# Patient Record
Sex: Female | Born: 2005 | Race: Black or African American | Hispanic: No | Marital: Single | Smoking: Never smoker
Health system: Southern US, Community
[De-identification: ages and names within clinical notes are randomized; demographics above are authoritative.]

---
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
## 2023-06-08 ENCOUNTER — Other Ambulatory Visit: Payer: Self-pay

## 2023-06-08 ENCOUNTER — Encounter: Payer: Self-pay | Admitting: Allergy & Immunology

## 2023-06-08 ENCOUNTER — Ambulatory Visit (INDEPENDENT_AMBULATORY_CARE_PROVIDER_SITE_OTHER): Payer: Medicaid Other | Admitting: Allergy & Immunology

## 2023-06-08 VITALS — BP 106/70 | HR 68 | Temp 98.3°F | Ht 61.5 in | Wt 127.4 lb

## 2023-06-08 DIAGNOSIS — L500 Allergic urticaria: Secondary | ICD-10-CM | POA: Diagnosis not present

## 2023-06-08 DIAGNOSIS — J302 Other seasonal allergic rhinitis: Secondary | ICD-10-CM | POA: Diagnosis not present

## 2023-06-08 DIAGNOSIS — R21 Rash and other nonspecific skin eruption: Secondary | ICD-10-CM | POA: Diagnosis not present

## 2023-06-08 DIAGNOSIS — T7800XA Anaphylactic reaction due to unspecified food, initial encounter: Secondary | ICD-10-CM | POA: Insufficient documentation

## 2023-06-08 DIAGNOSIS — J3089 Other allergic rhinitis: Secondary | ICD-10-CM | POA: Diagnosis not present

## 2023-06-08 MED ORDER — LEVOCETIRIZINE DIHYDROCHLORIDE 5 MG PO TABS: 5.0000 mg | ORAL_TABLET | Freq: Every evening | ORAL | 1 refills | Status: DC

## 2023-06-08 MED ORDER — EPINEPHRINE 0.3 MG/0.3ML IJ SOAJ: 0.3000 mg | Freq: Once | INTRAMUSCULAR | 2 refills | Status: AC

## 2023-06-08 MED ORDER — EUCRISA 2 % EX OINT: TOPICAL_OINTMENT | CUTANEOUS | 5 refills | Status: DC

## 2023-06-08 MED ORDER — MONTELUKAST SODIUM 10 MG PO TABS: 10.0000 mg | ORAL_TABLET | Freq: Every day | ORAL | 1 refills | Status: DC

## 2023-06-08 NOTE — Progress Notes (Signed)
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 Seasonal and perennial allergic rhinitis - Testing today showed: grasses, ragweed, weeds, trees, indoor molds, outdoor molds, dust mites, cockroach, and mixed feather - Copy of test results provided.  - Avoidance measures provided. - Stop taking: current medications - Continue with:  - Start taking: Xyzal (levocetirizine) 5mg  tablet once daily, Singulair (montelukast) 10mg  daily, and Flonase (fluticasone) one spray per nostril daily (AIM FOR EAR ON EACH SIDE) - You can use an extra dose of the antihistamine, if needed, for breakthrough symptoms.  - Singulair can cause irritability and bad dreams, so beware of this and stop the medication if that happens.  - Consider nasal saline rinses 1-2 times daily to remove allergens from the nasal cavities as well as help with mucous clearance (this is especially helpful to do before the nasal sprays are given) - Consider allergy shots as a means of long-term control. - Allergy shots "re-train" and "reset" the immune system to ignore environmental allergens and decrease the resulting immune response to those allergens (sneezing, itchy watery eyes, runny nose, nasal congestion, etc).    - Allergy shots improve symptoms in 75-85% of patients and are a cure for allergies.  - We can discuss more at the next appointment if the medications are not working for you.  2. Food allergies - Testing was reactive to egg and VERY REACTIVE to  cashew. - Copy of testing results provided today - I would continue to eat egg since you are eating it without a problem. - I would just avoid all tree nuts since you do have some mouth itching from it. - EpiPen training provided. - Emergency anaphylaxis plan provided.   3. Facial rash - I am not sure what this rash is, but I think that since it is itchy, this is most likely eczema. - Add on Eucrisa twice daily to the face (this is NOT a steroid and will not cause side effects of steroids). - It can rarely sting, so put it into the refrigerator to cool it down.  - We will see how this is looking at the next visit and make changes if needed.   4. Return in about 4 weeks (around 07/06/2023). You can have the follow up appointment with Dr. Dellis Anes or a Nurse Practicioner (our Nurse Practitioners are excellent and always have Physician oversight!).   This note in its entirety was forwarded to the Provider who requested this consultation.  Subjective:   Amanda Burton is a 17 y.o. female presenting today for evaluation of  Chief Complaint  Patient presents with   Establish Care   Nasal Congestion    C/o sneezing constantly itchy throat.   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  Amanda Burton was referred by Bobbie Stack, MD.     Amanda Burton is a 18 y.o. female presenting for an evaluation of environmental allergies .  Allergic Rhinitis Symptom History: She has constant  sneezing throughout the year. She has always had issues with allergies.  There is no worse time of the year. She does think that the summer is the worst. She does have symptoms during the winter months as well. She has tried using Benadryl, Claritin, Zyrtec, and any over the counter items. She has not been on nose sprays at all. She has never been allergy tested in the past.   Food Allergy Symptom History: She does et everything without a problem. She has never had throat swelling or itching at all. She does report that her tongue breaks out occasionally. 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 She is eating everything anyway and does not consistently make her break out.  She does have some mouth itching with cashew. She does not eat tree nuts a lot anyway for whatever reason.   Skin Symptom History: She has some discoloration of her nose.  This started this summer. It is pruritic. It is peeling constantly. She thought hat this was sunburn. She has tried using Vaseline to keep it moisturized, but then it is back to being dry and irritated. She thinks that she is mostly itching during the day time. It does not seem to worsen with any particular food.   There is no history of autoimmunity in the family. There is no history of fevers or joint pain.   Otherwise, there is no history of other atopic diseases, including drug allergies, stinging insect allergies, or contact dermatitis. There is no significant infectious history. Vaccinations are up to date.    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 If you have any questions, ask your nurse or doctor.          EPINEPHrine 0.3 mg/0.3 mL Soaj injection Commonly known as: EpiPen 2-Pak Inject 0.3 mg into the muscle once for 1 dose. Started by: Napoleon Form 2 % Oint Generic drug: Crisaborole Use twice daily as needed for facial rash. Started by: Alfonse Spruce   levocetirizine 5 MG tablet Commonly known as: XYZAL Take 1 tablet (5 mg total) by mouth every evening. Started by: Alfonse Spruce   metroNIDAZOLE 500 MG tablet Commonly known as: FLAGYL Take 1 tablet (500 mg total) by mouth 2 (two) times daily.   montelukast 10 MG tablet Commonly known as: Singulair Take 1 tablet (10 mg total) by mouth at bedtime. Started by: Alfonse Spruce   Nexplanon 68 MG Impl implant Generic drug: etonogestrel  1 each by Subdermal route once.        Birth History: non-contributory  Developmental History: non-contributory  Past Surgical History: History reviewed. No pertinent surgical history.   Family History: Family History  Problem Relation Age of Onset   Diabetes Father      Social History: Angi lives at home with her family.  She lives in a house with tile throughout the home.  She has electric heating and central cooling.  There are dogs outside of the home.  There are dust mite covers on the bed, but not the pillows.  There is vape exposure in the house as well as the car.  There is no fume, chemical, or dust exposure.  They do not use a HEPA filter.  She graduated high school in May 2023.  She is thinking about doing cosmetology school.   Review of systems otherwise negative other than that mentioned in the HPI.    Objective:   Blood pressure 106/70, pulse 68, temperature 98.3 F (36.8 C), height 5' 1.5" (1.562 m), weight 127 lb 6.4 oz (57.8 kg),  SpO2 99%. Body mass index is 23.68 kg/m.     Physical Exam Constitutional:      Appearance: She is well-developed.  HENT:     Head: Normocephalic and atraumatic.     Right Ear: Tympanic membrane, ear canal and external ear normal. No drainage, swelling or tenderness. Tympanic membrane is not injected, scarred, erythematous, retracted or bulging.     Left Ear: Tympanic membrane, ear canal and external ear normal. No drainage, swelling or tenderness. Tympanic membrane is not injected, scarred, erythematous, retracted or bulging.     Nose: No nasal deformity, septal deviation, mucosal edema or rhinorrhea.     Right Turbinates: Enlarged, swollen and pale.     Left Turbinates: Enlarged, swollen and pale.     Right Sinus: No maxillary sinus tenderness or frontal sinus tenderness.     Left Sinus: No maxillary sinus tenderness or frontal sinus tenderness.     Comments: No polyps noted.     Mouth/Throat:     Mouth: Mucous membranes are not pale and not dry.     Pharynx: Uvula midline.  Eyes:     General:        Right eye: No discharge.        Left eye: No discharge.     Conjunctiva/sclera: Conjunctivae normal.     Right eye: Right conjunctiva is not injected. No chemosis.    Left eye: Left conjunctiva is not injected. No chemosis.    Pupils: Pupils are equal, round, and reactive to light.  Cardiovascular:     Rate and Rhythm: Normal rate and regular rhythm.     Heart sounds: Normal heart sounds.  Pulmonary:     Effort: Pulmonary effort is normal. No tachypnea, accessory muscle usage or respiratory distress.     Breath sounds: Normal breath sounds. No wheezing, rhonchi or rales.  Chest:     Chest wall: No tenderness.  Abdominal:     Tenderness: There is no abdominal tenderness. There is no guarding or rebound.  Lymphadenopathy:     Head:     Right side of head: No submandibular, tonsillar or occipital adenopathy.     Left side of head: No submandibular, tonsillar or occipital  adenopathy.     Cervical: No cervical adenopathy.  Skin:    General: Skin is warm.     Capillary Refill: Capillary refill takes less than 2 seconds.  Coloration: Skin is not pale.     Findings: No abrasion, erythema, petechiae or rash. Rash is not papular, urticarial or vesicular.     Comments: She does have a rash on her face that is in a butterfly distribution. It is slightly hypopigmented. See picture below.   Neurological:     Mental Status: She is alert.        Diagnostic studies:   Allergy Studies:     Airborne Adult Perc - 06/08/23 1533     Time Antigen Placed 1533    Allergen Manufacturer Waynette Buttery    Location Back    Number of Test 55    1. Control-Buffer 50% Glycerol Negative    2. Control-Histamine 2+    3. Bahia 2+    4. [place] Negative    5. Johnson 2+    6. Kentucky Blue 4+    7. Meadow Fescue 4+    8. Perennial Rye 4+    9. Timothy 4+    10. Ragweed Mix 4+    11. Cocklebur 2+    12. Plantain,  English 3+    13. Baccharis 2+    14. Dog Fennel 2+    15. Gu[place] Thistle 2+    16. Lamb's Quarters 2+    17. Sheep Sorrell 2+    18. Rough Pigweed Negative    19. Marsh Elder, Rough Negative    20. Mugwort, Common 2+    21. Box, Elder 2+    22. Cedar, red Negative    23. Sweet Gum 4+    24. Pecan Pollen 4+    25. Pine Mix 4+    26. Walnut, Black Pollen 4+    27. Red Mulberry 4+    28. Ash Mix 2+    29. Birch Mix 3+    30. Beech American 3+    31. Cottonwood, [place] 2+    32. Hickory, White 4+    33. Maple Mix 4+    34. Oak, [place] Mix 4+    35. Sycamore Eastern 4+    36. Alternaria Alternata 4+    37. Cladosporium Herbarum 3+    38. Aspergillus Mix 3+    39. Penicillium Mix 2+    40. Bipolaris Sorokiniana (Helminthosporium) 2+    41. Drechslera Spicifera (Curvularia) Negative    42. Mucor Plumbeus 3+    43. Fusarium Moniliforme 2+    44. Aureobasidium Pullulans (pullulara) 3+    45. Rhizopus Oryzae 3+    46. Botrytis Cinera Negative     47. Epicoccum Nigrum 3+    48. Phoma Betae 2+    49. Dust Mite Mix 4+    50. Cat Hair 10,000 BAU/ml Negative    51.  Dog Epithelia Negative    52. Mixed Feathers 2+    53. Horse Epithelia Negative    54. Cockroach, German 3+    55. Tobacco Leaf Negative             13 Food Perc - 06/08/23 1533       Test Information   Time Antigen Placed 1533    Allergen Manufacturer Waynette Buttery    Location Back    Number of allergen test 13      Food   1. Peanut Negative    2. Soybean Negative    3. Wheat Negative    4. Sesame Negative    5. Milk, Cow Negative    6. Casein Negative    7. Egg [place],  Chicken --   7 x 10   8. Shellfish Mix Negative    9. Fish Mix Negative    10. Cashew --   35 x 50   11. Walnut Food Negative    12. Almond Negative    13. Hazelnut Negative             Allergy testing results were read and interpreted by myself, documented by clinical staff.         Malachi Bonds, MD [facility]

## 2023-06-08 NOTE — Patient Instructions (Addendum)
1. Seasonal and perennial allergic rhinitis - Testing today showed: grasses, ragweed, weeds, trees, indoor molds, outdoor molds, dust mites, cockroach, and mixed feather - Copy of test results provided.  - Avoidance measures provided. - Stop taking: current medications - Continue with:  - Start taking: Xyzal (levocetirizine) 5mg  tablet once daily, Singulair (montelukast) 10mg  daily, and Flonase (fluticasone) one spray per nostril daily (AIM FOR EAR ON EACH SIDE) - You can use an extra dose of the antihistamine, if needed, for breakthrough symptoms.  - Singulair can cause irritability and bad dreams, so beware of this and stop the medication if that happens.  - Consider nasal saline rinses 1-2 times daily to remove allergens from the nasal cavities as well as help with mucous clearance (this is especially helpful to do before the nasal sprays are given) - Consider allergy shots as a means of long-term control. - Allergy shots "re-train" and "reset" the immune system to ignore environmental allergens and decrease the resulting immune response to those allergens (sneezing, itchy watery eyes, runny nose, nasal congestion, etc).    - Allergy shots improve symptoms in 75-85% of patients and are a cure for allergies.  - We can discuss more at the next appointment if the medications are not working for you.  2. Food allergies - Testing was reactive to egg and VERY REACTIVE to cashew. - Copy of testing results provided today - I would continue to eat egg since you are eating it without a problem. - I would just avoid all tree nuts since you do have some mouth itching from it. - EpiPen training provided. - Emergency anaphylaxis plan provided.   3. Facial rash - I am not sure what this rash is, but I think that since it is itchy, this is most likely eczema. - Add on Eucrisa twice daily to the face (this is NOT a steroid and will not cause side effects of steroids). - It can rarely sting, so put it  into the refrigerator to cool it down.  - We will see how this is looking at the next visit and make changes if needed.   4. Return in about 4 weeks (around 07/06/2023). You can have the follow up appointment with Dr. Dellis Anes or a Nurse Practicioner (our Nurse Practitioners are excellent and always have Physician oversight!).    Please inform us of any Emergency Department visits, hospitalizations, or changes in symptoms. Call us before going to the ED for breathing or allergy symptoms since we might be able to fit you in for a sick visit. Feel free to contact us anytime with any questions, problems, or concerns.  It was a pleasure to meet you today!  Websites that have reliable patient information: 1. American Academy of Asthma, Allergy, and Immunology: www.aaaai.org 2. Food Allergy Research and Education (FARE): foodallergy.org 3. Mothers of Asthmatics: http://www.asthmacommunitynetwork.org 4. American College of Allergy, Asthma, and Immunology: www.acaai.org   COVID-19 Vaccine Information can be found at: [email or web address] For questions related to vaccine distribution or appointments, please email [email or web address] or call [phone].     "Like" [place] on Facebook and Instagram for our latest updates!      A healthy democracy works best when [employer] participate! Make sure you are registered to vote! If you have moved or changed any of your contact information, you will need to get this updated before voting! Scan the QR codes below to learn more!        Airborne Adult Perc - 06/08/23 1533  Time Antigen Placed 1533    Allergen Manufacturer Greer    Location Back    Number of Test 55    1. Control-Buffer 50% Glycerol Negative    2. Control-Histamine 2+    3. Bahia 2+    4. [place] Negative    5. Johnson 2+    6. Kentucky Blue 4+    7. Meadow Fescue 4+    8. Perennial Rye 4+    9. Timothy 4+    10.  Ragweed Mix 4+    11. Cocklebur 2+    12. Plantain,  English 3+    13. Baccharis 2+    14. Dog Fennel 2+    15. Gu[place] Thistle 2+    16. Lamb's Quarters 2+    17. Sheep Sorrell 2+    18. Rough Pigweed Negative    19. Marsh Elder, Rough Negative    20. Mugwort, Common 2+    21. Box, Elder 2+    22. Cedar, red Negative    23. Sweet Gum 4+    24. Pecan Pollen 4+    25. Pine Mix 4+    26. Walnut, Black Pollen 4+    27. Red Mulberry 4+    28. Ash Mix 2+    29. Birch Mix 3+    30. Beech American 3+    31. Cottonwood, [place] 2+    32. Hickory, White 4+    33. Maple Mix 4+    34. Oak, [place] Mix 4+    35. Sycamore Eastern 4+    36. Alternaria Alternata 4+    37. Cladosporium Herbarum 3+    38. Aspergillus Mix 3+    39. Penicillium Mix 2+    40. Bipolaris Sorokiniana (Helminthosporium) 2+    41. Drechslera Spicifera (Curvularia) Negative    42. Mucor Plumbeus 3+    43. Fusarium Moniliforme 2+    44. Aureobasidium Pullulans (pullulara) 3+    45. Rhizopus Oryzae 3+    46. Botrytis Cinera Negative    47. Epicoccum Nigrum 3+    48. Phoma Betae 2+    49. Dust Mite Mix 4+    50. Cat Hair 10,000 BAU/ml Negative    51.  Dog Epithelia Negative    52. Mixed Feathers 2+    53. Horse Epithelia Negative    54. Cockroach, German 3+    55. Tobacco Leaf Negative             13 Food Perc - 06/08/23 1533       Test Information   Time Antigen Placed 1533    Allergen Manufacturer Waynette Buttery    Location Back    Number of allergen test 13      Food   1. Peanut Negative    2. Soybean Negative    3. Wheat Negative    4. Sesame Negative    5. Milk, Cow Negative    6. Casein Negative    7. Egg White, Chicken --   7 x 10   8. Shellfish Mix Negative    9. Fish Mix Negative    10. Cashew --   35 x 50   11. Walnut Food Negative    12. Almond Negative    13. Hazelnut Negative             Reducing Pollen Exposure  The American Academy of Allergy, Asthma and Immunology  suggests the following steps to reduce your exposure to pollen during allergy seasons.  Do not hang sheets or clothing out to dry; pollen may collect on these items. Do not mow lawns or spend time around freshly cut grass; mowing stirs up pollen. Keep windows closed at night.  Keep car windows closed while driving. Minimize morning activities outdoors, a time when pollen counts are usually at their highest. Stay indoors as much as possible when pollen counts or humidity is high and on windy days when pollen tends to remain in the air longer. Use air conditioning when possible.  Many air conditioners have filters that trap the pollen spores. Use a HEPA room air filter to remove pollen form the indoor air you breathe.  Control of Mold Allergen   Mold and fungi can grow on a variety of surfaces provided certain temperature and moisture conditions exist.  Outdoor molds grow on plants, decaying vegetation and soil.  The major outdoor mold, Alternaria and Cladosporium, are found in very high numbers during hot and dry conditions.  Generally, a late Summer - Fall peak is seen for common outdoor fungal spores.  Rain will temporarily lower outdoor mold spore count, but counts rise rapidly when the rainy period ends.  The most important indoor molds are Aspergillus and Penicillium.  Dark, humid and poorly ventilated basements are ideal sites for mold growth.  The next most common sites of mold growth are the bathroom and the kitchen.  Outdoor (Seasonal) Mold Control   Use air conditioning and keep windows closed Avoid exposure to decaying vegetation. Avoid leaf raking. Avoid grain handling. Consider wearing a face mask if working in moldy areas.   Indoor (Perennial) Mold Control    Maintain humidity below 50%. Clean washable surfaces with 5% bleach solution. Remove sources e.g. contaminated carpets.    Control of Dust Mite Allergen    Dust mites play a major role in allergic asthma and  rhinitis.  They occur in environments with high humidity wherever human skin is found.  Dust mites absorb humidity from the atmosphere (ie, they do not drink) and feed on organic matter (including shed human and animal skin).  Dust mites are a microscopic type of insect that you cannot see with the naked eye.  High levels of dust mites have been detected from mattresses, pillows, carpets, upholstered furniture, bed covers, clothes, soft toys and any woven material.  The principal allergen of the dust mite is found in its feces.  A gram of dust may contain 1,000 mites and 250,000 fecal particles.  Mite antigen is easily measured in the air during house cleaning activities.  Dust mites do not bite and do not cause harm to humans, other than by triggering allergies/asthma.    Ways to decrease your exposure to dust mites in your home:  Encase mattresses, box springs and pillows with a mite-impermeable barrier or cover   Wash sheets, blankets and drapes weekly in hot water (130 F) with detergent and dry them in a dryer on the hot setting.  Have the room cleaned frequently with a vacuum cleaner and a damp dust-mop.  For carpeting or rugs, vacuuming with a vacuum cleaner equipped with a high-efficiency particulate air (HEPA) filter.  The dust mite allergic individual should not be in a room which is being cleaned and should wait 1 hour after cleaning before going into the room. Do not sleep on upholstered furniture (eg, couches).   If possible removing carpeting, upholstered furniture and drapery from the home is ideal.  Horizontal blinds should be eliminated in the rooms where  the person spends the most time (bedroom, study, television room).  Washable vinyl, roller-type shades are optimal. Remove all non-washable stuffed toys from the bedroom.  Wash stuffed toys weekly like sheets and blankets above.   Reduce indoor humidity to less than 50%.  Inexpensive humidity monitors can be purchased at most hardware  stores.  Do not use a humidifier as can make the problem worse and are not recommended.  Control of Cockroach Allergen  Cockroach allergen has been identified as an important cause of acute attacks of asthma, especially in urban settings.  There are fifty-five species of cockroach that exist in the [place], however only three, the [place], [place] species produce allergen that can affect patients with Asthma.  Allergens can be obtained from fecal particles, egg casings and secretions from cockroaches.    Remove food sources. Reduce access to water. Seal access and entry points. Spray runways with 0.5-1% Diazinon or Chlorpyrifos Blow boric acid power under stoves and refrigerator. Place bait stations (hydramethylnon) at feeding sites.  Allergy Shots  Allergies are the result of a chain reaction that starts in the immune system. Your immune system controls how your body defends itself. For instance, if you have an allergy to pollen, your immune system identifies pollen as an invader or allergen. Your immune system overreacts by producing antibodies called Immunoglobulin E (IgE). These antibodies travel to cells that release chemicals, causing an allergic reaction.  The concept behind allergy immunotherapy, whether it is received in the form of shots or tablets, is that the immune system can be desensitized to specific allergens that trigger allergy symptoms. Although it requires time and patience, the payback can be long-term relief. Allergy injections contain a dilute solution of those substances that you are allergic to based upon your skin testing and allergy history.   How Do Allergy Shots Work?  Allergy shots work much like a vaccine. Your body responds to injected amounts of a particular allergen given in increasing doses, eventually developing a resistance and tolerance to it. Allergy shots can lead to decreased, minimal or no allergy symptoms.  There generally are two  phases: build-up and maintenance. Build-up often ranges from three to six months and involves receiving injections with increasing amounts of the allergens. The shots are typically given once or twice a week, though more rapid build-up schedules are sometimes used.  The maintenance phase begins when the most effective dose is reached. This dose is different for each person, depending on how allergic you are and your response to the build-up injections. Once the maintenance dose is reached, there are longer periods between injections, typically two to four weeks.  Occasionally doctors give cortisone-type shots that can temporarily reduce allergy symptoms. These types of shots are different and should not be confused with allergy immunotherapy shots.  Who Can Be Treated with Allergy Shots?  Allergy shots may be a good treatment approach for people with allergic rhinitis (hay fever), allergic asthma, conjunctivitis (eye allergy) or stinging insect allergy.   Before deciding to begin allergy shots, you should consider:   The length of allergy season and the severity of your symptoms  Whether medications and/or changes to your environment can control your symptoms  Your desire to avoid long-term medication use  Time: allergy immunotherapy requires a major time commitment  Cost: may vary depending on your insurance coverage  Allergy shots for children age 39 and older are effective and often well tolerated. They might prevent the onset of new allergen  sensitivities or the progression to asthma.  Allergy shots are not started on patients who are pregnant but can be continued on patients who become pregnant while receiving them. In some patients with other medical conditions or who take certain common medications, allergy shots may be of risk. It is important to mention other medications you talk to your allergist.   What are the two types of build-ups offered:   RUSH or Rapid Desensitization --  one day of injections lasting from 8:30-4:30pm, injections every 1 hour.  Approximately half of the build-up process is completed in that one day.  The following week, normal build-up is resumed, and this entails ~16 visits either weekly or twice weekly, until reaching your "maintenance dose" which is continued weekly until eventually getting spaced out to every month for a duration of 3 to 5 years. The regular build-up appointments are nurse visits where the injections are administered, followed by required monitoring for 30 minutes.    Traditional build-up -- weekly visits for 6 -12 months until reaching "maintenance dose", then continue weekly until eventually spacing out to every 4 weeks as above. At these appointments, the injections are administered, followed by required monitoring for 30 minutes.     Either way is acceptable, and both are equally effective. With the rush protocol, the advantage is that less time is spent here for injections overall AND you would also reach maintenance dosing faster (which is when the clinical benefit starts to become more apparent). Not everyone is a candidate for rapid desensitization.   IF we proceed with the RUSH protocol, there are premedications which must be taken the day before and the day after the rush only (this includes antihistamines, steroids, and Singulair).  After the rush day, no prednisone or Singulair is required, and we just recommend antihistamines taken on your injection day.  What Is An Estimate of the Costs?  If you are interested in starting allergy injections, please check with your insurance company about your coverage for both allergy vial sets and allergy injections.  Please do so prior to making the appointment to start injections.  The following are CPT codes to give to your insurance company. These are the amounts we BILL to the insurance company, but the amount YOU WILL PAY and WE RECEIVE IS SUBSTANTIALLY LESS and depends on the  contracts we have with different insurance companies.   Amount Billed to Insurance One allergy vial set  CPT 95165   $ 1200     Two allergy vial set  CPT 95165   $ 2400     Three allergy vial set  CPT 95165   $ 3600     One injection   CPT 95115   $ 35  Two injections   CPT 95117   $ 40 RUSH (Rapid Desensitization) CPT 95180 x 8 hours $500/hour  Regarding the allergy injections, your co-pay may or may not apply with each injection, so please confirm this with your insurance company. When you start allergy injections, 1 or 2 sets of vials are made based on your allergies.  Not all patients can be on one set of vials. A set of vials lasts 6 months to a year depending on how quickly you can proceed with your build-up of your allergy injections. Vials are personalized for each patient depending on their specific allergens.  How often are allergy injection given during the build-up period?   Injections are given at least weekly during the build-up period until your  maintenance dose is achieved. Per the doctor's discretion, you may have the option of getting allergy injections two times per week during the build-up period. However, there must be at least 48 hours between injections. The build-up period is usually completed within 6-12 months depending on your ability to schedule injections and for adjustments for reactions. When maintenance dose is reached, your injection schedule is gradually changed to every two weeks and later to every three weeks. Injections will then continue every 4 weeks. Usually, injections are continued for a total of 3-5 years.   When Will I Feel Better?  Some may experience decreased allergy symptoms during the build-up phase. For others, it may take as long as 12 months on the maintenance dose. If there is no improvement after a year of maintenance, your allergist will discuss other treatment options with you.  If you aren't responding to allergy shots, it may be because  there is not enough dose of the allergen in your vaccine or there are missing allergens that were not identified during your allergy testing. Other reasons could be that there are high levels of the allergen in your environment or major exposure to non-allergic triggers like tobacco smoke.  What Is the Length of Treatment?  Once the maintenance dose is reached, allergy shots are generally continued for three to five years. The decision to stop should be discussed with your allergist at that time. Some people may experience a permanent reduction of allergy symptoms. Others may relapse and a longer course of allergy shots can be considered.  What Are the Possible Reactions?  The two types of adverse reactions that can occur with allergy shots are local and systemic. Common local reactions include very mild redness and swelling at the injection site, which can happen immediately or several hours after. Report a delayed reaction from your last injection. These include arm swelling or runny nose, watery eyes or cough that occurs within 12-24 hours after injection. A systemic reaction, which is less common, affects the entire body or a particular body system. They are usually mild and typically respond quickly to medications. Signs include increased allergy symptoms such as sneezing, a stuffy nose or hives.   Rarely, a serious systemic reaction called anaphylaxis can develop. Symptoms include swelling in the throat, wheezing, a feeling of tightness in the chest, nausea or dizziness. Most serious systemic reactions develop within 30 minutes of allergy shots. This is why it is strongly recommended you wait in your doctor's office for 30 minutes after your injections. Your allergist is trained to watch for reactions, and his or her staff is trained and equipped with the proper medications to identify and treat them.   Report to the nurse immediately if you experience any of the following symptoms: swelling,  itching or redness of the skin, hives, watery eyes/nose, breathing difficulty, excessive sneezing, coughing, stomach pain, diarrhea, or light headedness. These symptoms may occur within 15-20 minutes after injection and may require medication.   Who Should Administer Allergy Shots?  The preferred location for receiving shots is your prescribing allergist's office. Injections can sometimes be given at another facility where the physician and staff are trained to recognize and treat reactions, and have received instructions by your prescribing allergist.  What if I am late for an injection?   Injection dose will be adjusted depending upon how many days or weeks you are late for your injection.   What if I am sick?   Please report any illness to the  nurse before receiving injections. She may adjust your dose or postpone injections depending on your symptoms. If you have fever, flu, sinus infection or chest congestion it is best to postpone allergy injections until you are better. Never get an allergy injection if your asthma is causing you problems. If your symptoms persist, seek out medical care to get your health problem under control.  What If I am or Become Pregnant:  Women that become pregnant should schedule an appointment with [facility] before receiving any further allergy injections.

## 2023-06-09 ENCOUNTER — Other Ambulatory Visit: Payer: Self-pay

## 2023-06-09 MED ORDER — EPINEPHRINE 0.3 MG/0.3ML IJ SOAJ: 0.3000 mg | Freq: Once | INTRAMUSCULAR | 1 refills | Status: AC

## 2023-06-10 ENCOUNTER — Other Ambulatory Visit (HOSPITAL_COMMUNITY): Payer: Self-pay

## 2023-06-10 ENCOUNTER — Telehealth: Payer: Self-pay

## 2023-06-10 NOTE — Telephone Encounter (Signed)
*  Asthma/Allergy  Pharmacy Patient Advocate Encounter   Received notification from CoverMyMeds that prior authorization for Eucrisa 2% ointment  is required/requested.   Insurance verification completed.   The patient is insured through [facility] .   Per test claim: PA required; PA submitted to [facility] via CoverMyMeds Key/confirmation #/EOC [id number] Status is pending

## 2023-06-14 ENCOUNTER — Other Ambulatory Visit (HOSPITAL_COMMUNITY): Payer: Self-pay

## 2023-06-14 NOTE — Telephone Encounter (Signed)
Pharmacy Patient Advocate Encounter  Received notification from [facility] that Prior Authorization for Eucrisa 2% ointment  has been APPROVED from 06/10/2023 to 06/09/2024. Ran test claim, Copay is $refill too soon. This test claim was processed through [facility]- copay amounts may vary at other pharmacies due to pharmacy/plan contracts, or as the patient moves through the different stages of their insurance plan.

## 2023-07-08 ENCOUNTER — Telehealth: Payer: Self-pay | Admitting: Family Medicine

## 2023-07-08 NOTE — Telephone Encounter (Signed)
Patient father called asked if provider will accept her as a new patient. Father already sees Gilmore Laroche. Call back # [phone].

## 2023-07-08 NOTE — Telephone Encounter (Signed)
scheduled

## 2023-07-08 NOTE — Telephone Encounter (Signed)
yes

## 2023-07-11 NOTE — Patient Instructions (Incomplete)
1. Seasonal and perennial allergic rhinitis - Testing on 06/18/23 showed: grasses, ragweed, weeds, trees, indoor molds, outdoor molds, dust mites, cockroach, and mixed feather - Continue voidance measures as below . - Start taking: Xyzal (levocetirizine) 5mg  tablet once daily, Singulair (montelukast) 10mg  daily, and Flonase (fluticasone) one spray per nostril daily (AIM FOR EAR ON EACH SIDE) - You can use an extra dose of the antihistamine, if needed, for breakthrough symptoms.  - Singulair can cause irritability and bad dreams, so beware of this and stop the medication if that happens.  - Consider nasal saline rinses 1-2 times daily to remove allergens from the nasal cavities as well as help with mucous clearance (this is especially helpful to do before the nasal sprays are given) - Consider allergy shots as a means of long-term control. - Allergy shots "re-train" and "reset" the immune system to ignore environmental allergens and decrease the resulting immune response to those allergens (sneezing, itchy watery eyes, runny nose, nasal congestion, etc).    - Allergy shots improve symptoms in 75-85% of patients and are a cure for allergies.  - We can discuss more at the next appointment if the medications are not working for you.  2. Food allergies - Testing on 06/18/23 was reactive to egg and VERY REACTIVE to cashew. - Continue to eat egg since you are eating it without a problem. - I would just avoid all tree nuts since you do have some mouth itching from it. -  have access to EpiPen at all times. Prescription sent. - Emergency anaphylaxis plan provided at previous office visit.   3. Facial rash - Add on Eucrisa twice daily to the face (this is NOT a steroid and will not cause side effects of steroids). - It can rarely sting, so put it into the refrigerator to cool it down.  - We will see how this is looking at the next visit and make changes if needed.   4.Schedule a follow up appointment  in 6 weeks or sooner if needed       Reducing Pollen Exposure  The American Academy of All[employer], Ast[employer] suggests the following steps to reduce your exposure to pollen during allergy seasons.    Do not hang sheets or clothing out to dry; pollen may collect on these items. Do not mow lawns or spend time around freshly cut grass; mowing stirs up pollen. Keep windows closed at night.  Keep car windows closed while driving. Minimize morning activities outdoors, a time when pollen counts are usually at their highest. Stay indoors as much as possible when pollen counts or humidity is high and on windy days when pollen tends to remain in the air longer. Use air conditioning when possible.  Many air conditioners have filters that trap the pollen spores. Use a HEPA room air filter to remove pollen form the indoor air you breathe.  Control of Mold Allergen   Mold and fungi can grow on a variety of surfaces provided certain temperature and moisture conditions exist.  Outdoor molds grow on plants, decaying vegetation and soil.  The major outdoor mold, Alternaria and Cladosporium, are found in very high numbers during hot and dry conditions.  Generally, a late Summer - Fall peak is seen for common outdoor fungal spores.  Rain will temporarily lower outdoor mold spore count, but counts rise rapidly when the rainy period ends.  The most important indoor molds are Aspergillus and Penicillium.  Dark, humid and poorly ventilated basements are ideal  sites for mold growth.  The next most common sites of mold growth are the bathroom and the kitchen.  Outdoor (Seasonal) Mold Control   Use air conditioning and keep windows closed Avoid exposure to decaying vegetation. Avoid leaf raking. Avoid grain handling. Consider wearing a face mask if working in moldy areas.   Indoor (Perennial) Mold Control    Maintain humidity below 50%. Clean washable surfaces with 5% bleach solution. Remove  sources e.g. contaminated carpets.    Control of Dust Mite Allergen    Dust mites play a major role in allergic asthma and rhinitis.  They occur in environments with high humidity wherever human skin is found.  Dust mites absorb humidity from the atmosphere (ie, they do not drink) and feed on organic matter (including shed human and animal skin).  Dust mites are a microscopic type of insect that you cannot see with the naked eye.  High levels of dust mites have been detected from mattresses, pillows, carpets, upholstered furniture, bed covers, clothes, soft toys and any woven material.  The principal allergen of the dust mite is found in its feces.  A gram of dust may contain 1,000 mites and 250,000 fecal particles.  Mite antigen is easily measured in the air during house cleaning activities.  Dust mites do not bite and do not cause harm to humans, other than by triggering allergies/asthma.    Ways to decrease your exposure to dust mites in your home:  Encase mattresses, box springs and pillows with a mite-impermeable barrier or cover   Wash sheets, blankets and drapes weekly in hot water (130 F) with detergent and dry them in a dryer on the hot setting.  Have the room cleaned frequently with a vacuum cleaner and a damp dust-mop.  For carpeting or rugs, vacuuming with a vacuum cleaner equipped with a high-efficiency particulate air (HEPA) filter.  The dust mite allergic individual should not be in a room which is being cleaned and should wait 1 hour after cleaning before going into the room. Do not sleep on upholstered furniture (eg, couches).   If possible removing carpeting, upholstered furniture and drapery from the home is ideal.  Horizontal blinds should be eliminated in the rooms where the person spends the most time (bedroom, study, television room).  Washable vinyl, roller-type shades are optimal. Remove all non-washable stuffed toys from the bedroom.  Wash stuffed toys weekly like sheets  and blankets above.   Reduce indoor humidity to less than 50%.  Inexpensive humidity monitors can be purchased at most hardware stores.  Do not use a humidifier as can make the problem worse and are not recommended.  Control of Cockroach Allergen  Cockroach allergen has been identified as an important cause of acute attacks of asthma, especially in urban settings.  There are fifty-five species of cockroach that exist in the [place], however only three, the [place], [place] species produce allergen that can affect patients with Asthma.  Allergens can be obtained from fecal particles, egg casings and secretions from cockroaches.    Remove food sources. Reduce access to water. Seal access and entry points. Spray runways with 0.5-1% Diazinon or Chlorpyrifos Blow boric acid power under stoves and refrigerator. Place bait stations (hydramethylnon) at feeding sites.  Allergy Shots  Allergies are the result of a chain reaction that starts in the immune system. Your immune system controls how your body defends itself. For instance, if you have an allergy to pollen, your immune system identifies pollen  as an invader or allergen. Your immune system overreacts by producing antibodies called Immunoglobulin E (IgE). These antibodies travel to cells that release chemicals, causing an allergic reaction.  The concept behind allergy immunotherapy, whether it is received in the form of shots or tablets, is that the immune system can be desensitized to specific allergens that trigger allergy symptoms. Although it requires time and patience, the payback can be long-term relief. Allergy injections contain a dilute solution of those substances that you are allergic to based upon your skin testing and allergy history.   How Do Allergy Shots Work?  Allergy shots work much like a vaccine. Your body responds to injected amounts of a particular allergen given in increasing doses, eventually developing a  resistance and tolerance to it. Allergy shots can lead to decreased, minimal or no allergy symptoms.  There generally are two phases: build-up and maintenance. Build-up often ranges from three to six months and involves receiving injections with increasing amounts of the allergens. The shots are typically given once or twice a week, though more rapid build-up schedules are sometimes used.  The maintenance phase begins when the most effective dose is reached. This dose is different for each person, depending on how allergic you are and your response to the build-up injections. Once the maintenance dose is reached, there are longer periods between injections, typically two to four weeks.  Occasionally doctors give cortisone-type shots that can temporarily reduce allergy symptoms. These types of shots are different and should not be confused with allergy immunotherapy shots.  Who Can Be Treated with Allergy Shots?  Allergy shots may be a good treatment approach for people with allergic rhinitis (hay fever), allergic asthma, conjunctivitis (eye allergy) or stinging insect allergy.   Before deciding to begin allergy shots, you should consider:   The length of allergy season and the severity of your symptoms  Whether medications and/or changes to your environment can control your symptoms  Your desire to avoid long-term medication use  Time: allergy immunotherapy requires a major time commitment  Cost: may vary depending on your insurance coverage  Allergy shots for children age 41 and older are effective and often well tolerated. They might prevent the onset of new allergen sensitivities or the progression to asthma.  Allergy shots are not started on patients who are pregnant but can be continued on patients who become pregnant while receiving them. In some patients with other medical conditions or who take certain common medications, allergy shots may be of risk. It is important to mention other  medications you talk to your allergist.   What are the two types of build-ups offered:   RUSH or Rapid Desensitization -- one day of injections lasting from 8:30-4:30pm, injections every 1 hour.  Approximately half of the build-up process is completed in that one day.  The following week, normal build-up is resumed, and this entails ~16 visits either weekly or twice weekly, until reaching your "maintenance dose" which is continued weekly until eventually getting spaced out to every month for a duration of 3 to 5 years. The regular build-up appointments are nurse visits where the injections are administered, followed by required monitoring for 30 minutes.    Traditional build-up -- weekly visits for 6 -12 months until reaching "maintenance dose", then continue weekly until eventually spacing out to every 4 weeks as above. At these appointments, the injections are administered, followed by required monitoring for 30 minutes.     Either way is acceptable, and both are  equally effective. With the rush protocol, the advantage is that less time is spent here for injections overall AND you would also reach maintenance dosing faster (which is when the clinical benefit starts to become more apparent). Not everyone is a candidate for rapid desensitization.   IF we proceed with the RUSH protocol, there are premedications which must be taken the day before and the day after the rush only (this includes antihistamines, steroids, and Singulair).  After the rush day, no prednisone or Singulair is required, and we just recommend antihistamines taken on your injection day.  What Is An Estimate of the Costs?  If you are interested in starting allergy injections, please check with your insurance company about your coverage for both allergy vial sets and allergy injections.  Please do so prior to making the appointment to start injections.  The following are CPT codes to give to your insurance company. These are the  amounts we BILL to the insurance company, but the amount YOU WILL PAY and WE RECEIVE IS SUBSTANTIALLY LESS and depends on the contracts we have with different insurance companies.   Amount Billed to Insurance One allergy vial set  CPT 95165   $ 1200     Two allergy vial set  CPT 95165   $ 2400     Three allergy vial set  CPT 95165   $ 3600     One injection   CPT 95115   $ 35  Two injections   CPT 95117   $ 40 RUSH (Rapid Desensitization) CPT 95180 x 8 hours $500/hour  Regarding the allergy injections, your co-pay may or may not apply with each injection, so please confirm this with your insurance company. When you start allergy injections, 1 or 2 sets of vials are made based on your allergies.  Not all patients can be on one set of vials. A set of vials lasts 6 months to a year depending on how quickly you can proceed with your build-up of your allergy injections. Vials are personalized for each patient depending on their specific allergens.  How often are allergy injection given during the build-up period?   Injections are given at least weekly during the build-up period until your maintenance dose is achieved. Per the doctor's discretion, you may have the option of getting allergy injections two times per week during the build-up period. However, there must be at least 48 hours between injections. The build-up period is usually completed within 6-12 months depending on your ability to schedule injections and for adjustments for reactions. When maintenance dose is reached, your injection schedule is gradually changed to every two weeks and later to every three weeks. Injections will then continue every 4 weeks. Usually, injections are continued for a total of 3-5 years.   When Will I Feel Better?  Some may experience decreased allergy symptoms during the build-up phase. For others, it may take as long as 12 months on the maintenance dose. If there is no improvement after a year of maintenance,  your allergist will discuss other treatment options with you.  If you aren't responding to allergy shots, it may be because there is not enough dose of the allergen in your vaccine or there are missing allergens that were not identified during your allergy testing. Other reasons could be that there are high levels of the allergen in your environment or major exposure to non-allergic triggers like tobacco smoke.  What Is the Length of Treatment?  Once the maintenance dose  is reached, allergy shots are generally continued for three to five years. The decision to stop should be discussed with your allergist at that time. Some people may experience a permanent reduction of allergy symptoms. Others may relapse and a longer course of allergy shots can be considered.  What Are the Possible Reactions?  The two types of adverse reactions that can occur with allergy shots are local and systemic. Common local reactions include very mild redness and swelling at the injection site, which can happen immediately or several hours after. Report a delayed reaction from your last injection. These include arm swelling or runny nose, watery eyes or cough that occurs within 12-24 hours after injection. A systemic reaction, which is less common, affects the entire body or a particular body system. They are usually mild and typically respond quickly to medications. Signs include increased allergy symptoms such as sneezing, a stuffy nose or hives.   Rarely, a serious systemic reaction called anaphylaxis can develop. Symptoms include swelling in the throat, wheezing, a feeling of tightness in the chest, nausea or dizziness. Most serious systemic reactions develop within 30 minutes of allergy shots. This is why it is strongly recommended you wait in your doctor's office for 30 minutes after your injections. Your allergist is trained to watch for reactions, and his or her staff is trained and equipped with the proper medications to  identify and treat them.   Report to the nurse immediately if you experience any of the following symptoms: swelling, itching or redness of the skin, hives, watery eyes/nose, breathing difficulty, excessive sneezing, coughing, stomach pain, diarrhea, or light headedness. These symptoms may occur within 15-20 minutes after injection and may require medication.   Who Should Administer Allergy Shots?  The preferred location for receiving shots is your prescribing allergist's office. Injections can sometimes be given at another facility where the physician and staff are trained to recognize and treat reactions, and have received instructions by your prescribing allergist.  What if I am late for an injection?   Injection dose will be adjusted depending upon how many days or weeks you are late for your injection.   What if I am sick?   Please report any illness to the nurse before receiving injections. She may adjust your dose or postpone injections depending on your symptoms. If you have fever, flu, sinus infection or chest congestion it is best to postpone allergy injections until you are better. Never get an allergy injection if your asthma is causing you problems. If your symptoms persist, seek out medical care to get your health problem under control.  What If I am or Become Pregnant:  Women that become pregnant should schedule an appointment with [facility] before receiving any further allergy injections.

## 2023-07-13 ENCOUNTER — Ambulatory Visit (INDEPENDENT_AMBULATORY_CARE_PROVIDER_SITE_OTHER): Payer: Medicaid Other | Admitting: Family

## 2023-07-13 ENCOUNTER — Encounter: Payer: Self-pay | Admitting: Family

## 2023-07-13 ENCOUNTER — Other Ambulatory Visit: Payer: Self-pay

## 2023-07-13 VITALS — BP 110/58 | HR 76 | Temp 98.7°F | Resp 20 | Ht 61.3 in | Wt 129.0 lb

## 2023-07-13 DIAGNOSIS — J302 Other seasonal allergic rhinitis: Secondary | ICD-10-CM | POA: Diagnosis not present

## 2023-07-13 DIAGNOSIS — J3089 Other allergic rhinitis: Secondary | ICD-10-CM

## 2023-07-13 DIAGNOSIS — R21 Rash and other nonspecific skin eruption: Secondary | ICD-10-CM

## 2023-07-13 DIAGNOSIS — L500 Allergic urticaria: Secondary | ICD-10-CM | POA: Diagnosis not present

## 2023-07-13 MED ORDER — EPINEPHRINE 0.3 MG/0.3ML IJ SOAJ: 0.3000 mg | INTRAMUSCULAR | 1 refills | Status: AC | PRN

## 2023-07-13 MED ORDER — FLUTICASONE PROPIONATE 50 MCG/ACT NA SUSP: NASAL | 5 refills | Status: AC

## 2023-07-13 NOTE — Progress Notes (Signed)
[address] [place] [address] Dept: [phone]  FOLLOW UP NOTE  Patient ID: Amanda Burton, female    DOB: July 16, 2006  Age: 17 y.o. 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 She was last seen on 06/08/23 by Dr. Dellis Anes. She denies any new diagnosis or surgeries since her last office visit.   She reports clear rhinorrhea and nasal congestion last night that is better now. She denies post nasal drip. She has not been treated for any sinus infections since we last saw her. She did not ever pick up the prescriptions that we prescribed at the last office visit (Xyzal, Singulair, and Flonase).  Food allergy: She continues to avoid tree nuts without any accidental ingestion. She does not have an epinephrine auto injector device,but reports that she was shown how to use one. She continues to eat egg without any problems.  Facial rash: She continues to have the facial rash. She did not ever pick up the Eucrisa that was prescribed at her last office visit. She describes the rash as itchy and painful. She has not ever seen dermatology.   Drug Allergies:  No Known Allergies  Review of Systems: Negative except as per HPI   Physical Exam: BP (!) 110/58   Pulse 76   Temp 98.7 F (37.1 C)   Resp 20   Ht 5' 1.3" (1.557 m)   Wt 129 lb (58.5 kg)   SpO2 99%   BMI 24.14 kg/m    Physical Exam Constitutional:      Appearance: Normal appearance.  HENT:     Head: Normocephalic and atraumatic.     Comments: Pharynx normal. Eyes normal. Ears normal. Nose: bilateral lower turbinates moderately edematous and erythematous with clear drainage noted    Right Ear: Tympanic membrane, ear canal and external ear  normal.     Left Ear: Tympanic membrane, ear canal and external ear normal.     Mouth/Throat:     Mouth: Mucous membranes are moist.     Pharynx: Oropharynx is clear.  Eyes:     Conjunctiva/sclera: Conjunctivae normal.  Cardiovascular:     Rate and Rhythm: Regular rhythm.     Heart sounds: Normal heart sounds.  Pulmonary:     Effort: Pulmonary effort is normal.     Breath sounds: Normal breath sounds.     Comments: Lungs clear to auscultation Musculoskeletal:     Cervical back: Neck supple.  Skin:    General: Skin is warm.     Comments: Hypopigmented skin noted across nose and cheeks  Neurological:     Mental Status: She is alert and oriented to person, place, and time.  Psychiatric:        Mood and Affect: Mood normal.        Behavior: Behavior normal.        Thought Content: Thought content normal.        Judgment: Judgment normal.     Diagnostics:  None  Assessment and Plan: 1. Seasonal and perennial allergic rhinitis   2. Allergic urticaria   3. Facial rash     Meds ordered this encounter  Medications   EPINEPHrine 0.3 mg/0.3 mL IJ SOAJ injection    Sig: Inject 0.3 mg into the muscle as  needed for anaphylaxis.    Dispense:  2 each    Refill:  1   fluticasone (FLONASE) 50 MCG/ACT nasal spray    Sig: Place 1-2 sprays in each nostril once a day as needed for stuffy nose.    Dispense:  16 g    Refill:  5    Patient Instructions  1. Seasonal and perennial allergic rhinitis - Testing on 06/18/23 showed: grasses, ragweed, weeds, trees, indoor molds, outdoor molds, dust mites, cockroach, and mixed feather - Continue voidance measures as below . - Start taking: Xyzal (levocetirizine) 5mg  tablet once daily, Singulair (montelukast) 10mg  daily, and Flonase (fluticasone) one spray per nostril daily (AIM FOR EAR ON EACH SIDE) - You can use an extra dose of the antihistamine, if needed, for breakthrough symptoms.  - Singulair can cause irritability and bad dreams, so  beware of this and stop the medication if that happens.  - Consider nasal saline rinses 1-2 times daily to remove allergens from the nasal cavities as well as help with mucous clearance (this is especially helpful to do before the nasal sprays are given) - Consider allergy shots as a means of long-term control. - Allergy shots "re-train" and "reset" the immune system to ignore environmental allergens and decrease the resulting immune response to those allergens (sneezing, itchy watery eyes, runny nose, nasal congestion, etc).    - Allergy shots improve symptoms in 75-85% of patients and are a cure for allergies.  - We can discuss more at the next appointment if the medications are not working for you.  2. Food allergies - Testing on 06/18/23 was reactive to egg and VERY REACTIVE to cashew. - Continue to eat egg since you are eating it without a problem. - I would just avoid all tree nuts since you do have some mouth itching from it. -  have access to EpiPen at all times. Prescription sent. - Emergency anaphylaxis plan provided at previous office visit.   3. Facial rash - Add on Eucrisa twice daily to the face (this is NOT a steroid and will not cause side effects of steroids). - It can rarely sting, so put it into the refrigerator to cool it down.  - We will see how this is looking at the next visit and make changes if needed.   4.Schedule a follow up appointment in 6 weeks or sooner if needed       Reducing Pollen Exposure  The American Academy of Allergy, Asthma and Immunology suggests the following steps to reduce your exposure to pollen during allergy seasons.    Do not hang sheets or clothing out to dry; pollen may collect on these items. Do not mow lawns or spend time around freshly cut grass; mowing stirs up pollen. Keep windows closed at night.  Keep car windows closed while driving. Minimize morning activities outdoors, a time when pollen counts are usually at their  highest. Stay indoors as much as possible when pollen counts or humidity is high and on windy days when pollen tends to remain in the air longer. Use air conditioning when possible.  Many air conditioners have filters that trap the pollen spores. Use a HEPA room air filter to remove pollen form the indoor air you breathe.  Control of Mold Allergen   Mold and fungi can grow on a variety of surfaces provided certain temperature and moisture conditions exist.  Outdoor molds grow on plants, decaying vegetation and soil.  The major outdoor mold, Alternaria and Cladosporium, are  found in very high numbers during hot and dry conditions.  Generally, a late Summer - Fall peak is seen for common outdoor fungal spores.  Rain will temporarily lower outdoor mold spore count, but counts rise rapidly when the rainy period ends.  The most important indoor molds are Aspergillus and Penicillium.  Dark, humid and poorly ventilated basements are ideal sites for mold growth.  The next most common sites of mold growth are the bathroom and the kitchen.  Outdoor (Seasonal) Mold Control   Use air conditioning and keep windows closed Avoid exposure to decaying vegetation. Avoid leaf raking. Avoid grain handling. Consider wearing a face mask if working in moldy areas.   Indoor (Perennial) Mold Control    Maintain humidity below 50%. Clean washable surfaces with 5% bleach solution. Remove sources e.g. contaminated carpets.    Control of Dust Mite Allergen    Dust mites play a major role in allergic asthma and rhinitis.  They occur in environments with high humidity wherever human skin is found.  Dust mites absorb humidity from the atmosphere (ie, they do not drink) and feed on organic matter (including shed human and animal skin).  Dust mites are a microscopic type of insect that you cannot see with the naked eye.  High levels of dust mites have been detected from mattresses, pillows, carpets, upholstered  furniture, bed covers, clothes, soft toys and any woven material.  The principal allergen of the dust mite is found in its feces.  A gram of dust may contain 1,000 mites and 250,000 fecal particles.  Mite antigen is easily measured in the air during house cleaning activities.  Dust mites do not bite and do not cause harm to humans, other than by triggering allergies/asthma.    Ways to decrease your exposure to dust mites in your home:  Encase mattresses, box springs and pillows with a mite-impermeable barrier or cover   Wash sheets, blankets and drapes weekly in hot water (130 F) with detergent and dry them in a dryer on the hot setting.  Have the room cleaned frequently with a vacuum cleaner and a damp dust-mop.  For carpeting or rugs, vacuuming with a vacuum cleaner equipped with a high-efficiency particulate air (HEPA) filter.  The dust mite allergic individual should not be in a room which is being cleaned and should wait 1 hour after cleaning before going into the room. Do not sleep on upholstered furniture (eg, couches).   If possible removing carpeting, upholstered furniture and drapery from the home is ideal.  Horizontal blinds should be eliminated in the rooms where the person spends the most time (bedroom, study, television room).  Washable vinyl, roller-type shades are optimal. Remove all non-washable stuffed toys from the bedroom.  Wash stuffed toys weekly like sheets and blankets above.   Reduce indoor humidity to less than 50%.  Inexpensive humidity monitors can be purchased at most hardware stores.  Do not use a humidifier as can make the problem worse and are not recommended.  Control of Cockroach Allergen  Cockroach allergen has been identified as an important cause of acute attacks of asthma, especially in urban settings.  There are fifty-five species of cockroach that exist in the [place], however only three, the [place], [place] species produce allergen that can  affect patients with Asthma.  Allergens can be obtained from fecal particles, egg casings and secretions from cockroaches.    Remove food sources. Reduce access to water. Seal access and entry points. Spray  runways with 0.5-1% Diazinon or Chlorpyrifos Blow boric acid power under stoves and refrigerator. Place bait stations (hydramethylnon) at feeding sites.  Allergy Shots  Allergies are the result of a chain reaction that starts in the immune system. Your immune system controls how your body defends itself. For instance, if you have an allergy to pollen, your immune system identifies pollen as an invader or allergen. Your immune system overreacts by producing antibodies called Immunoglobulin E (IgE). These antibodies travel to cells that release chemicals, causing an allergic reaction.  The concept behind allergy immunotherapy, whether it is received in the form of shots or tablets, is that the immune system can be desensitized to specific allergens that trigger allergy symptoms. Although it requires time and patience, the payback can be long-term relief. Allergy injections contain a dilute solution of those substances that you are allergic to based upon your skin testing and allergy history.   How Do Allergy Shots Work?  Allergy shots work much like a vaccine. Your body responds to injected amounts of a particular allergen given in increasing doses, eventually developing a resistance and tolerance to it. Allergy shots can lead to decreased, minimal or no allergy symptoms.  There generally are two phases: build-up and maintenance. Build-up often ranges from three to six months and involves receiving injections with increasing amounts of the allergens. The shots are typically given once or twice a week, though more rapid build-up schedules are sometimes used.  The maintenance phase begins when the most effective dose is reached. This dose is different for each person, depending on how allergic  you are and your response to the build-up injections. Once the maintenance dose is reached, there are longer periods between injections, typically two to four weeks.  Occasionally doctors give cortisone-type shots that can temporarily reduce allergy symptoms. These types of shots are different and should not be confused with allergy immunotherapy shots.  Who Can Be Treated with Allergy Shots?  Allergy shots may be a good treatment approach for people with allergic rhinitis (hay fever), allergic asthma, conjunctivitis (eye allergy) or stinging insect allergy.   Before deciding to begin allergy shots, you should consider:   The length of allergy season and the severity of your symptoms  Whether medications and/or changes to your environment can control your symptoms  Your desire to avoid long-term medication use  Time: allergy immunotherapy requires a major time commitment  Cost: may vary depending on your insurance coverage  Allergy shots for children age 36 and older are effective and often well tolerated. They might prevent the onset of new allergen sensitivities or the progression to asthma.  Allergy shots are not started on patients who are pregnant but can be continued on patients who become pregnant while receiving them. In some patients with other medical conditions or who take certain common medications, allergy shots may be of risk. It is important to mention other medications you talk to your allergist.   What are the two types of build-ups offered:   RUSH or Rapid Desensitization -- one day of injections lasting from 8:30-4:30pm, injections every 1 hour.  Approximately half of the build-up process is completed in that one day.  The following week, normal build-up is resumed, and this entails ~16 visits either weekly or twice weekly, until reaching your "maintenance dose" which is continued weekly until eventually getting spaced out to every month for a duration of 3 to 5 years. The  regular build-up appointments are nurse visits where the injections are  administered, followed by required monitoring for 30 minutes.    Traditional build-up -- weekly visits for 6 -12 months until reaching "maintenance dose", then continue weekly until eventually spacing out to every 4 weeks as above. At these appointments, the injections are administered, followed by required monitoring for 30 minutes.     Either way is acceptable, and both are equally effective. With the rush protocol, the advantage is that less time is spent here for injections overall AND you would also reach maintenance dosing faster (which is when the clinical benefit starts to become more apparent). Not everyone is a candidate for rapid desensitization.   IF we proceed with the RUSH protocol, there are premedications which must be taken the day before and the day after the rush only (this includes antihistamines, steroids, and Singulair).  After the rush day, no prednisone or Singulair is required, and we just recommend antihistamines taken on your injection day.  What Is An Estimate of the Costs?  If you are interested in starting allergy injections, please check with your insurance company about your coverage for both allergy vial sets and allergy injections.  Please do so prior to making the appointment to start injections.  The following are CPT codes to give to your insurance company. These are the amounts we BILL to the insurance company, but the amount YOU WILL PAY and WE RECEIVE IS SUBSTANTIALLY LESS and depends on the contracts we have with different insurance companies.   Amount Billed to Insurance One allergy vial set  CPT 95165   $ 1200     Two allergy vial set  CPT 95165   $ 2400     Three allergy vial set  CPT 95165   $ 3600     One injection   CPT 95115   $ 35  Two injections   CPT 95117   $ 40 RUSH (Rapid Desensitization) CPT 95180 x 8 hours $500/hour  Regarding the allergy injections, your co-pay may or  may not apply with each injection, so please confirm this with your insurance company. When you start allergy injections, 1 or 2 sets of vials are made based on your allergies.  Not all patients can be on one set of vials. A set of vials lasts 6 months to a year depending on how quickly you can proceed with your build-up of your allergy injections. Vials are personalized for each patient depending on their specific allergens.  How often are allergy injection given during the build-up period?   Injections are given at least weekly during the build-up period until your maintenance dose is achieved. Per the doctor's discretion, you may have the option of getting allergy injections two times per week during the build-up period. However, there must be at least 48 hours between injections. The build-up period is usually completed within 6-12 months depending on your ability to schedule injections and for adjustments for reactions. When maintenance dose is reached, your injection schedule is gradually changed to every two weeks and later to every three weeks. Injections will then continue every 4 weeks. Usually, injections are continued for a total of 3-5 years.   When Will I Feel Better?  Some may experience decreased allergy symptoms during the build-up phase. For others, it may take as long as 12 months on the maintenance dose. If there is no improvement after a year of maintenance, your allergist will discuss other treatment options with you.  If you aren't responding to allergy shots, it may be  because there is not enough dose of the allergen in your vaccine or there are missing allergens that were not identified during your allergy testing. Other reasons could be that there are high levels of the allergen in your environment or major exposure to non-allergic triggers like tobacco smoke.  What Is the Length of Treatment?  Once the maintenance dose is reached, allergy shots are generally continued for  three to five years. The decision to stop should be discussed with your allergist at that time. Some people may experience a permanent reduction of allergy symptoms. Others may relapse and a longer course of allergy shots can be considered.  What Are the Possible Reactions?  The two types of adverse reactions that can occur with allergy shots are local and systemic. Common local reactions include very mild redness and swelling at the injection site, which can happen immediately or several hours after. Report a delayed reaction from your last injection. These include arm swelling or runny nose, watery eyes or cough that occurs within 12-24 hours after injection. A systemic reaction, which is less common, affects the entire body or a particular body system. They are usually mild and typically respond quickly to medications. Signs include increased allergy symptoms such as sneezing, a stuffy nose or hives.   Rarely, a serious systemic reaction called anaphylaxis can develop. Symptoms include swelling in the throat, wheezing, a feeling of tightness in the chest, nausea or dizziness. Most serious systemic reactions develop within 30 minutes of allergy shots. This is why it is strongly recommended you wait in your doctor's office for 30 minutes after your injections. Your allergist is trained to watch for reactions, and his or her staff is trained and equipped with the proper medications to identify and treat them.   Report to the nurse immediately if you experience any of the following symptoms: swelling, itching or redness of the skin, hives, watery eyes/nose, breathing difficulty, excessive sneezing, coughing, stomach pain, diarrhea, or light headedness. These symptoms may occur within 15-20 minutes after injection and may require medication.   Who Should Administer Allergy Shots?  The preferred location for receiving shots is your prescribing allergist's office. Injections can sometimes be given at another  facility where the physician and staff are trained to recognize and treat reactions, and have received instructions by your prescribing allergist.  What if I am late for an injection?   Injection dose will be adjusted depending upon how many days or weeks you are late for your injection.   What if I am sick?   Please report any illness to the nurse before receiving injections. She may adjust your dose or postpone injections depending on your symptoms. If you have fever, flu, sinus infection or chest congestion it is best to postpone allergy injections until you are better. Never get an allergy injection if your asthma is causing you problems. If your symptoms persist, seek out medical care to get your health problem under control.  What If I am or Become Pregnant:  Women that become pregnant should schedule an appointment with [facility] before receiving any further allergy injections.      Return in about 6 weeks (around 08/24/2023), or if symptoms worsen or fail to improve.    Thank you for the opportunity to care for this patient.  Please do not hesitate to contact me with questions.  Nehemiah Settle, FNP [facility]

## 2023-08-10 ENCOUNTER — Ambulatory Visit: Payer: Medicaid Other | Admitting: Family Medicine

## 2023-08-31 ENCOUNTER — Ambulatory Visit (INDEPENDENT_AMBULATORY_CARE_PROVIDER_SITE_OTHER): Payer: Medicaid Other | Admitting: Allergy & Immunology

## 2023-08-31 ENCOUNTER — Encounter: Payer: Self-pay | Admitting: Allergy & Immunology

## 2023-08-31 VITALS — BP 110/68 | HR 89 | Temp 98.2°F | Resp 14 | Wt 130.4 lb

## 2023-08-31 DIAGNOSIS — J3089 Other allergic rhinitis: Secondary | ICD-10-CM

## 2023-08-31 DIAGNOSIS — J302 Other seasonal allergic rhinitis: Secondary | ICD-10-CM

## 2023-08-31 DIAGNOSIS — R21 Rash and other nonspecific skin eruption: Secondary | ICD-10-CM

## 2023-08-31 DIAGNOSIS — L500 Allergic urticaria: Secondary | ICD-10-CM | POA: Diagnosis not present

## 2023-08-31 MED ORDER — MONTELUKAST SODIUM 10 MG PO TABS: 10.0000 mg | ORAL_TABLET | Freq: Every day | ORAL | 3 refills | Status: AC

## 2023-08-31 MED ORDER — LEVOCETIRIZINE DIHYDROCHLORIDE 5 MG PO TABS: 5.0000 mg | ORAL_TABLET | Freq: Every evening | ORAL | 3 refills | Status: AC

## 2023-08-31 MED ORDER — AZELASTINE HCL 0.1 % NA SOLN: 2.0000 | Freq: Two times a day (BID) | NASAL | 11 refills | Status: AC

## 2023-08-31 NOTE — Progress Notes (Addendum)
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 Seasonal and perennial allergic rhinitis - Testing at the previous visits: grasses, ragweed, weeds, trees, indoor molds, outdoor molds, dust mites, cockroach, and mixed feather - I agree with your plan to do the nose spray only.  - Continue taking: Xyzal (levocetirizine) 5mg  tablet once daily, Singulair (montelukast) 10mg  daily, and Flonase (fluticasone) one spray per nostril daily (AIM FOR EAR ON EACH SIDE) - Add on: Astelin (azelastine) one to two sprays per nostril twice daily  - You can use an extra dose of the antihistamine, if needed, for breakthrough symptoms.  - Singulair can cause irritability and bad dreams, so beware of this and stop the medication if that happens.  - Consider nasal saline rinses 1-2 times daily to remove allergens from the nasal cavities as well as help with mucous clearance (this is especially helpful to do before the nasal sprays are given) - Strongly consider allergy shots as a means of long-term control. - Allergy shots "re-train" and "reset" the immune system to ignore environmental allergens and decrease the resulting immune response to those allergens (sneezing, itchy watery eyes, runny nose, nasal congestion, etc).    - Allergy shots improve symptoms in 75-85% of patients and are a cure for allergies.  - We can discuss more at the next appointment if the medications are not working for you.  2. Food allergies (tree nuts) - Continue to avoid tree nuts as you are doing.  - I would just avoid all tree nuts since you do have some mouth itching from it. -  EpiPen is up to date.  - Emergency anaphylaxis plan up to date.   3. Facial rash - I am not sure what this rash is, but I think that since it is itchy, this is most likely eczema. - Continue with vaseline since this is working without a problem. - I am sorry that the previous ointment was stinging you so badly.   4. Return in about 6 months (around 02/28/2024). You can have the follow up appointment with Dr. Dellis Anes or a Nurse Practicioner (our Nurse Practitioners are excellent and always have Physician oversight!).     Subjective:   Amanda Burton is a 17 y.o. female presenting today for follow up of  Chief Complaint  Patient presents with   Follow-up    Eucrisa burned and stopped using it.     Amanda Burton has a history of the following: Patient Active Problem List   Diagnosis Date Noted   Anaphylactic shock due to adverse food reaction 06/08/2023   Facial rash 06/08/2023   Seasonal and perennial allergic rhinitis 06/08/2023   Dysmenorrhea in adolescent 06/10/2021   Breakthrough bleeding on Nexplanon 06/10/2021   Nexplanon in place 10/23/2018   Human papilloma virus (HPV) type 9 vaccine administered 10/23/2018    History obtained from: chart review and patient.  Discussed the use of AI scribe software for clinical note transcription with the patient and/or guardian, who gave verbal consent to proceed.  Amanda Burton is a 17 y.o. female presenting for a follow up visit.  She was last seen in October 2024.  At that time, she was started  on Xyzal and montelukast.  For food allergies, she was eating egg without a problem so excited to enjoy it.  She continue to avoid tree nuts.  For her rash, we will add Eucrisa.  Since the last visit, she has done well.   Allergic Rhinitis Symptom History: The patient also reported environmental allergies, which were managed with Xyzal, montelukast, and a nasal spray. However, she reported some residual symptoms.  She has not been on antibiotics for  any sinus or ear infections.   Food Allergy Symptom History: She is avoiding tree nuts. She is not interested in reintroducing them at all. EpiPen is up to date.   Skin Symptom History: Amanda Burton reports a severe burning sensation on the face after using Eucrisa. The burning was so intense that she was unable to wash it off and it persisted for a week. She stopped using Eucrisa and medicated the area, which led to improvement.  The patient also has a history of wearing wigs, which she reported does not irritate her scalp. She recently purchased a new wig, which she is comfortable with.  Otherwise, there have been no changes to her past medical history, surgical history, family history, or social history.    Review of systems otherwise negative other than that mentioned in the HPI.    Objective:   Blood pressure 110/68, pulse 89, temperature 98.2 F (36.8 C), resp. rate 14, weight 130 lb 6 oz (59.1 kg), SpO2 98%. Body mass index is 24.39 kg/m.    Physical Exam Vitals reviewed.  Constitutional:      Appearance: She is well-developed.     Comments: Hot pink and cinnamon wig on today.   HENT:     Head: Normocephalic and atraumatic.     Right Ear: Tympanic membrane, ear canal and external ear normal. No drainage, swelling or tenderness. Tympanic membrane is not injected, scarred, erythematous, retracted or bulging.     Left Ear: Tympanic membrane, ear canal and external ear normal. No drainage, swelling or tenderness. Tympanic membrane is not injected, scarred, erythematous, retracted or bulging.     Nose: No nasal deformity, septal deviation, mucosal edema or rhinorrhea.     Right Turbinates: Enlarged, swollen and pale.     Left Turbinates: Enlarged, swollen and pale.     Right Sinus: No maxillary sinus tenderness or frontal sinus tenderness.     Left Sinus: No maxillary sinus tenderness or frontal sinus tenderness.     Comments: No polyps noted.     Mouth/Throat:     Mouth: Mucous  membranes are not pale and not dry.     Pharynx: Uvula midline.  Eyes:     General:        Right eye: No discharge.        Left eye: No discharge.     Conjunctiva/sclera: Conjunctivae normal.     Right eye: Right conjunctiva is not injected. No chemosis.    Left eye: Left conjunctiva is not injected. No chemosis.    Pupils: Pupils are equal, round, and reactive to light.  Cardiovascular:     Rate and Rhythm: Normal rate and regular rhythm.     Heart sounds: Normal heart sounds.  Pulmonary:     Effort: Pulmonary effort is normal. No tachypnea, accessory muscle usage or respiratory distress.     Breath sounds: Normal breath sounds. No wheezing, rhonchi or rales.  Chest:     Chest wall: No tenderness.  Abdominal:     Tenderness: There is no  abdominal tenderness. There is no guarding or rebound.  Lymphadenopathy:     Head:     Right side of head: No submandibular, tonsillar or occipital adenopathy.     Left side of head: No submandibular, tonsillar or occipital adenopathy.     Cervical: No cervical adenopathy.  Skin:    General: Skin is warm.     Capillary Refill: Capillary refill takes less than 2 seconds.     Coloration: Skin is not pale.     Findings: No abrasion, erythema, petechiae or rash. Rash is not papular, urticarial or vesicular.     Comments: Skin appears smooth without lesions.   Neurological:     Mental Status: She is alert.  Psychiatric:        Behavior: Behavior is cooperative.      Diagnostic studies: none      Malachi Bonds, MD  [facility]

## 2023-08-31 NOTE — Patient Instructions (Addendum)
1. Seasonal and perennial allergic rhinitis - Testing at the previous visits: grasses, ragweed, weeds, trees, indoor molds, outdoor molds, dust mites, cockroach, and mixed feather - I agree with your plan to do the nose spray only.  - Continue taking: Xyzal (levocetirizine) 5mg  tablet once daily, Singulair (montelukast) 10mg  daily, and Flonase (fluticasone) one spray per nostril daily (AIM FOR EAR ON EACH SIDE) - Add on: Astelin (azelastine) one to two sprays per nostril twice daily  - You can use an extra dose of the antihistamine, if needed, for breakthrough symptoms.  - Singulair can cause irritability and bad dreams, so beware of this and stop the medication if that happens.  - Consider nasal saline rinses 1-2 times daily to remove allergens from the nasal cavities as well as help with mucous clearance (this is especially helpful to do before the nasal sprays are given) - Strongly consider allergy shots as a means of long-term control. - Allergy shots "re-train" and "reset" the immune system to ignore environmental allergens and decrease the resulting immune response to those allergens (sneezing, itchy watery eyes, runny nose, nasal congestion, etc).    - Allergy shots improve symptoms in 75-85% of patients and are a cure for allergies.  - We can discuss more at the next appointment if the medications are not working for you.  2. Food allergies (tree nuts) - Continue to avoid tree nuts as you are doing.  - I would just avoid all tree nuts since you do have some mouth itching from it. - EpiPen is up to date.  - Emergency anaphylaxis plan up to date.   3. Facial rash - I am not sure what this rash is, but I think that since it is itchy, this is most likely eczema. - Continue with vaseline since this is working without a problem. - I am sorry that the previous ointment was stinging you so badly.   4. Return in about 6 months (around 02/28/2024). You can have the follow up appointment with Dr.  Dellis Anes or a Nurse Practicioner (our Nurse Practitioners are excellent and always have Physician oversight!).    Please inform us of any Emergency Department visits, hospitalizations, or changes in symptoms. Call us before going to the ED for breathing or allergy symptoms since we might be able to fit you in for a sick visit. Feel free to contact us anytime with any questions, problems, or concerns.  It was a pleasure to see you again today!  Websites that have reliable patient information: 1. American Academy of Asthma, Allergy, and Immunology: www.aaaai.org 2. Food Allergy Research and Education (FARE): foodallergy.org 3. Mothers of Asthmatics: http://www.asthmacommunitynetwork.org 4. American College of Allergy, Asthma, and Immunology: www.acaai.org      "Like" [place] on Facebook and Instagram for our latest updates!      A healthy democracy works best when [employer] participate! Make sure you are registered to vote! If you have moved or changed any of your contact information, you will need to get this updated before voting! Scan the QR codes below to learn more!

## 2023-09-05 ENCOUNTER — Ambulatory Visit: Payer: Medicaid Other | Admitting: Family Medicine

## 2023-09-14 ENCOUNTER — Encounter: Payer: Self-pay | Admitting: Family Medicine

## 2023-11-04 ENCOUNTER — Emergency Department (HOSPITAL_COMMUNITY)
Admission: EM | Admit: 2023-11-04 | Discharge: 2023-11-04 | Discharge status: Against Medical Advice | Payer: Medicaid Other

## 2023-11-04 NOTE — ED Notes (Signed)
Called for Pt from waiting room. No Answer.

## 2023-11-04 NOTE — ED Notes (Signed)
Called for Pt X 3 from waiting room. No Answer.

## 2023-11-10 ENCOUNTER — Ambulatory Visit: Payer: Self-pay | Admitting: Family Medicine

## 2023-11-22 ENCOUNTER — Encounter: Payer: Self-pay | Admitting: Family Medicine

## 2024-08-29 ENCOUNTER — Telehealth: Payer: Self-pay

## 2024-08-29 NOTE — Telephone Encounter (Signed)
 Pt requesting refill of OCPs originally prescribed in 2022, d/t spotting and irregular bleeding. Pt states she has not been taking consistently only as needed, and last taken approx 2 months ago. Advised pt last seen for Nexplanon  replacement in 2023 and did not follow up w provider as advised for OCPs. Discussed w pt the importance of annuals and at this time cannot send a refill until she is seen in office. She is currently scheduled 10/09/24 for nexplanon  replacement. Offered to schedule appt sooner to discuss w provider and pt disconnected call.

## 2024-09-05 ENCOUNTER — Ambulatory Visit: Payer: Medicaid Other | Admitting: Allergy & Immunology

## 2024-10-09 ENCOUNTER — Encounter: Payer: Self-pay | Admitting: Obstetrics and Gynecology

## 2024-10-09 ENCOUNTER — Ambulatory Visit (INDEPENDENT_AMBULATORY_CARE_PROVIDER_SITE_OTHER): Admitting: Obstetrics and Gynecology

## 2024-10-09 ENCOUNTER — Other Ambulatory Visit (HOSPITAL_COMMUNITY)
Admission: RE | Admit: 2024-10-09 | Discharge: 2024-10-09 | Disposition: A | Discharge status: Home / Self Care | Source: Ambulatory Visit | Attending: Obstetrics and Gynecology | Admitting: Obstetrics and Gynecology

## 2024-10-09 VITALS — BP 106/70 | HR 76 | Ht 62.0 in | Wt 128.0 lb

## 2024-10-09 DIAGNOSIS — Z975 Presence of (intrauterine) contraceptive device: Secondary | ICD-10-CM | POA: Diagnosis not present

## 2024-10-09 DIAGNOSIS — N898 Other specified noninflammatory disorders of vagina: Secondary | ICD-10-CM

## 2024-10-09 DIAGNOSIS — N921 Excessive and frequent menstruation with irregular cycle: Secondary | ICD-10-CM

## 2024-10-09 MED ORDER — LEVONORGESTREL-ETHINYL ESTRAD 0.1-20 MG-MCG PO TABS: 1.0000 | ORAL_TABLET | Freq: Every day | ORAL | 2 refills | Status: AC

## 2024-10-09 NOTE — Progress Notes (Signed)
" ° °  ESTABLISHED GYNECOLOGY VISIT Chief Complaint  Patient presents with   Contraception    NEXPLANON     Subjective:  Amanda Burton is a 19 y.o. initially presented for nexplanon  exchange but also reports vaginal discharge.  Has had nexplanon  since 10/2021 Has breakthrough bleeding and irregular bleeding with it. Has used OCPs in the past to regular period and wants to do a few months of that now. Reports yellow discharge with irritation x 1 month    Review of Systems:   Pertinent items are noted in HPI  Pertinent History Reviewed:  Reviewed past medical,surgical, social and family history.  Reviewed problem list, medications and allergies.  Objective:   Vitals:   10/09/24 1321  BP: 106/70  Pulse: 76  Weight: 128 lb (58.1 kg)  Height: 5' 2 (1.575 m)   Physical Examination:   General appearance - well appearing, and in no distress  Mental status - alert, oriented to person, place, and time  Psych:  normal mood and affect  Skin - warm and dry, normal color, no suspicious lesions noted  Pelvic - vaginal swab collected, pt declines speculum exam  Chaperone present for exam  Assessment and Plan:  1. Nexplanon  in place (Primary) Counseled that studies demonstrate that the implant is as effective as the IUD for up to 5 years and it is reasonable to leave it in until 5 years however we can exchange today if she would like. She is comfortable with 5 years of use. - continue nexplanon   2. Breakthrough bleeding on Nexplanon  OCPs for management of bleeding. Denies history of hypertension, venous thromboembolism, migraines with aura, liver disease   - levonorgestrel -ethinyl estradiol  (AVIANE) 0.1-20 MG-MCG tablet; Take 1 tablet by mouth daily.  Dispense: 28 tablet; Refill: 2  3. Vaginal discharge Blind swab done - Cervicovaginal ancillary only( [place])     No follow-ups on file.  No future appointments.  Rollo Amanda Bring, MD, FACOG Obstetrician & Gynecologist,  [facility] for [facility], [facility] "

## 2024-10-09 NOTE — Progress Notes (Addendum)
 18 y.o. GYN presents for Contraception management.  Pt c/o irregular bleeding with the Nexplanon  she was given OCP to regulate bleeding, but she is out of the pills she also c/o yellowish vaginal discharge, odor, itching.

## 2024-10-10 LAB — CERVICOVAGINAL ANCILLARY ONLY
Chlamydia: NEGATIVE
Comment: NEGATIVE
Comment: NEGATIVE
Comment: NORMAL
Neisseria Gonorrhea: NEGATIVE
Trichomonas: POSITIVE — AB

## 2024-10-11 ENCOUNTER — Ambulatory Visit: Payer: Self-pay | Admitting: Obstetrics and Gynecology

## 2024-10-11 ENCOUNTER — Encounter

## 2024-10-11 DIAGNOSIS — A599 Trichomoniasis, unspecified: Secondary | ICD-10-CM

## 2024-10-11 MED ORDER — METRONIDAZOLE 500 MG PO TABS: 500.0000 mg | ORAL_TABLET | Freq: Two times a day (BID) | ORAL | 0 refills | Status: AC

## 2024-10-18 ENCOUNTER — Ambulatory Visit: Payer: Self-pay

## 2024-10-31 ENCOUNTER — Other Ambulatory Visit: Payer: Self-pay

## 2024-10-31 ENCOUNTER — Ambulatory Visit
Admission: EM | Admit: 2024-10-31 | Discharge: 2024-10-31 | Disposition: A | Discharge status: Home / Self Care | Attending: Physician Assistant | Admitting: Physician Assistant

## 2024-10-31 DIAGNOSIS — K6289 Other specified diseases of anus and rectum: Secondary | ICD-10-CM | POA: Diagnosis present

## 2024-10-31 DIAGNOSIS — R300 Dysuria: Secondary | ICD-10-CM | POA: Insufficient documentation

## 2024-10-31 DIAGNOSIS — N898 Other specified noninflammatory disorders of vagina: Secondary | ICD-10-CM | POA: Insufficient documentation

## 2024-10-31 LAB — POCT URINE DIPSTICK
Glucose, UA: NEGATIVE mg/dL
Nitrite, UA: NEGATIVE
POC PROTEIN,UA: 100 — AB
Spec Grav, UA: >=1.03 — AB
Urobilinogen, UA: 1 U/dL
pH, UA: 6.5

## 2024-10-31 LAB — POCT URINE PREGNANCY: Preg Test, Ur: NEGATIVE

## 2024-10-31 NOTE — Discharge Instructions (Addendum)
 We will keep you updated on the results of your cervicovaginal swab once the results are available.  If medication or treatment is indicated by those results that will be sent into the pharmacy that we have on file. Please make sure that you are practicing safe sex and using barrier methods to prevent exposure. It is recommended to avoid intercourse until you have the results back from testing and have completed any treatments that are sent in for you.    We have also completed a swab for HSV, also known as genital herpes testing as you did have some concerning lesions along your vulvovaginal area.  These results should also be available in the next 2 to 4 days and you will receive a phone call if there are any positives that need to be addressed.  Your urine did show some evidence of white blood cells as well as blood.  I am sending a sample off for a urine culture to definitively rule out a urinary tract infection.  We will keep you updated on those results once they are available and if any medication needs to be scented to your pharmacy.  To help with your rectal pain I recommend using Tylenol and ibuprofen , warm water soaks or sitz bath, which hazel pads, Preparation H.

## 2024-10-31 NOTE — ED Provider Notes (Signed)
 " [facility]    CSN: [id number] Arrival date & time: 10/31/24  1323      History   Chief Complaint Chief Complaint  Patient presents with   Dysuria    HPI Amanda Burton is a 19 y.o. female.   HPI  Pt is here today with concerns for vaginal irritation and rash along with rectal pain. She reports this has been ongoing for about 2 days. She reports there are red bumps in the vulvovaginal area and she reports it is painful.  She is sexually active with a single female partner and she is unsure if they have been exposed to an STD or if they are having symptoms.  She denies having anal sex recently or diarrhea, constipation which would cause rectal irritation.   History reviewed. No pertinent past medical history.  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 No pertinent surgical history.  OB History   No obstetric history on file.      Home Medications    Prior to Admission medications  Medication Sig Start Date End Date Taking? Authorizing Provider  azelastine  (ASTELIN ) 0.1 % nasal spray Place 2 sprays into both nostrils 2 (two) times daily. Use in each nostril as directed Patient not taking: Reported on 10/09/2024 08/31/23   Iva Marty Saltness, MD  EPINEPHrine  0.3 mg/0.3 mL IJ SOAJ injection Inject 0.3 mg into the muscle as needed for anaphylaxis. 07/13/23   Cheryl Reusing, FNP  etonogestrel  (NEXPLANON ) 68 MG IMPL implant 1 each by Subdermal route once.    [provider]  fluticasone  (FLONASE ) 50 MCG/ACT nasal spray Place 1-2 sprays in each nostril once a day as needed for stuffy nose. Patient  not taking: Reported on 10/09/2024 07/13/23   Cheryl Reusing, FNP  levocetirizine (XYZAL ) 5 MG tablet Take 1 tablet (5 mg total) by mouth every evening. Patient not taking: Reported on 10/09/2024 08/31/23 11/29/23  Iva Marty Saltness, MD  levonorgestrel -ethinyl estradiol  (AVIANE) 0.1-20 MG-MCG tablet Take 1 tablet by mouth daily. 10/09/24   Abigail Rollo DASEN, MD  montelukast  (SINGULAIR ) 10 MG tablet Take 1 tablet (10 mg total) by mouth at bedtime. Patient not taking: Reported on 10/09/2024 08/31/23 11/29/23  Iva Marty Saltness, MD    Family History Family History  Problem Relation Age of Onset   Diabetes Father     Social History Social History[1]   Allergies   Patient has no known allergies.   Review of Systems Review of Systems  Constitutional:  Negative for chills and fever.  Gastrointestinal:  Positive for rectal pain.  Genitourinary:  Positive for dysuria, vaginal bleeding, vaginal discharge and vaginal pain. Negative for flank pain.     Physical Exam Triage Vital Signs ED Triage Vitals  Encounter Vitals Group     BP 10/31/24 1424 129/69     Girls Systolic BP Percentile --      Girls Diastolic BP Percentile --      Boys Systolic BP Percentile --      Boys Diastolic BP Percentile --      Pulse Rate 10/31/24 1424 98     Resp 10/31/24 1424 17  Temp 10/31/24 1424 99.4 F (37.4 C)     Temp Source 10/31/24 1424 Oral     SpO2 10/31/24 1424 96 %     Weight --      Height --      Head Circumference --      Peak Flow --      Pain Score 10/31/24 1422 5     Pain Loc --      Pain Education --      Exclude from Growth Chart --    No data found.  Updated Vital Signs BP 129/69 (BP Location: Left Arm)   Pulse 98   Temp 99.4 F (37.4 C) (Oral)   Resp 17   LMP 10/30/2024 (Approximate) Comment: noticed vaginal spotting yesterday.  SpO2 96%   Visual Acuity Right Eye Distance:   Left Eye Distance:   Bilateral Distance:    Right Eye Near:   Left Eye Near:     Bilateral Near:     Physical Exam Vitals reviewed. Exam conducted with a chaperone present.  Constitutional:      General: She is awake.     Appearance: Normal appearance. She is well-developed and well-groomed.  HENT:     Head: Normocephalic and atraumatic.  Eyes:     General: Lids are normal. Gaze aligned appropriately.     Extraocular Movements: Extraocular movements intact.     Conjunctiva/sclera: Conjunctivae normal.  Pulmonary:     Effort: Pulmonary effort is normal.  Genitourinary:    Pubic Area: Rash present.     Labia:        Right: Rash, tenderness and lesion present.      Vagina: Vaginal discharge present.     Rectum: Tenderness present. No mass, anal fissure or external hemorrhoid. Normal anal tone.     Comments: Chaperoned by Rosina Sharps, RN  External GU exam was notable for some lesions along the inferior aspect of the vaginal opening. HSV swab collected from these lesions. Pt does have evidence of vaginal discharge as well.  Musculoskeletal:     Cervical back: Normal range of motion.  Neurological:     Mental Status: She is alert and oriented to person, place, and time.  Psychiatric:        Attention and Perception: Attention and perception normal.        Mood and Affect: Mood and affect normal.        Speech: Speech normal.        Behavior: Behavior normal. Behavior is cooperative.        Thought Content: Thought content normal.        Judgment: Judgment normal.      UC Treatments / Results  Labs (all labs ordered are listed, but only abnormal results are displayed) Labs Reviewed  POCT URINE DIPSTICK - Abnormal; Notable for the following components:      Result Value   Clarity, UA cloudy (*)    Bilirubin, UA small (*)    Ketones, POC UA >= (160) (*)    Spec Grav, UA >=1.030 (*)    Blood, UA moderate (*)    POC PROTEIN,UA =100 (*)    Leukocytes, UA Small (1+) (*)    All other components within normal limits  URINE CULTURE  HSV 1/2 PCR (SURFACE)   POCT URINE PREGNANCY  CERVICOVAGINAL ANCILLARY ONLY    EKG   Radiology No results found.  Procedures Procedures (including critical care time)  Medications Ordered in UC Medications - No data  to display  Initial Impression / Assessment and Plan / UC Course  I have reviewed the triage vital signs and the nursing notes.  Pertinent labs & imaging results that were available during my care of the patient were reviewed by me and considered in my medical decision making (see chart for details).      Final Clinical Impressions(s) / UC Diagnoses   Final diagnoses:  Vaginal irritation  Dysuria  Rectal pain   Patient presents today with concerns of vulvovaginal irritation, rectal pain, pain with urination.  She reports this has been ongoing for about 2 days.  Urine dip is notable for leukocytes as well as blood but I am concerned this may be contamination from vulvovaginal process versus true UTI.  Urine culture sent off for definitive rule out.  Will also get cervicovaginal swab to assess for BV, yeast, trichomoniasis, gonorrhea, chlamydia.  GU exam completed which did demonstrate vaginal discharge and tenderness along the vaginal opening.  There appears to be several papular lesions along the bottom edge of the labia minora which were swabbed for HSV.  Reviewed with patient that test results will be available in her MyChart and she will be contacted with any positive results that require intervention.  Follow-up as needed.    Discharge Instructions      We will keep you updated on the results of your cervicovaginal swab once the results are available.  If medication or treatment is indicated by those results that will be sent into the pharmacy that we have on file. Please make sure that you are practicing safe sex and using barrier methods to prevent exposure. It is recommended to avoid intercourse until you have the results back from testing and have completed any treatments that are  sent in for you.    We have also completed a swab for HSV, also known as genital herpes testing as you did have some concerning lesions along your vulvovaginal area.  These results should also be available in the next 2 to 4 days and you will receive a phone call if there are any positives that need to be addressed.  Your urine did show some evidence of white blood cells as well as blood.  I am sending a sample off for a urine culture to definitively rule out a urinary tract infection.  We will keep you updated on those results once they are available and if any medication needs to be scented to your pharmacy.  To help with your rectal pain I recommend using Tylenol and ibuprofen , warm water soaks or sitz bath, which hazel pads, Preparation H.       ED Prescriptions   None    PDMP not reviewed this encounter.     [1]  Social History Tobacco Use   Smoking status: Every Day    Types: Cigarettes    Passive exposure: Past   Smokeless tobacco: Never  Vaping Use   Vaping status: Never Used  Substance Use Topics   Alcohol use: Yes   Drug use: Yes    Types: Marijuana     Nevaeh Casillas, [place] BRAVO, PA-C 11/01/24 1256  "

## 2024-10-31 NOTE — ED Triage Notes (Signed)
 Pt presents with a chief complaint of dysuria x 3 days. States it burns when she urinates. Also voices irritation of her vagina and rectum. Currently states her rectum is causing her the most pain, rates as a 5/10 on pain scale. Was prescribed Flagyl  on 1/8 for testing trich+. Was able to finish this antibiotic.

## 2024-11-01 LAB — CERVICOVAGINAL ANCILLARY ONLY
Bacterial Vaginitis (gardnerella): POSITIVE — AB
Candida Glabrata: NEGATIVE
Candida Vaginitis: NEGATIVE
Chlamydia: NEGATIVE
Comment: NEGATIVE
Comment: NEGATIVE
Comment: NEGATIVE
Comment: NEGATIVE
Comment: NEGATIVE
Comment: NORMAL
Neisseria Gonorrhea: NEGATIVE
Trichomonas: POSITIVE — AB

## 2024-11-01 LAB — HSV 1/2 PCR (SURFACE)
HSV-1 DNA: NOT DETECTED
HSV-2 DNA: DETECTED — AB

## 2024-11-02 LAB — URINE CULTURE: Culture: 40000 — AB

## 2024-11-03 ENCOUNTER — Ambulatory Visit (HOSPITAL_COMMUNITY): Payer: Self-pay

## 2024-11-04 MED ORDER — VALACYCLOVIR HCL 1 G PO TABS: 1000.0000 mg | ORAL_TABLET | Freq: Two times a day (BID) | ORAL | 0 refills | Status: AC

## 2024-11-04 MED ORDER — SULFAMETHOXAZOLE-TRIMETHOPRIM 800-160 MG PO TABS: 1.0000 | ORAL_TABLET | Freq: Two times a day (BID) | ORAL | 0 refills | Status: AC

## 2024-11-04 MED ORDER — METRONIDAZOLE 500 MG PO TABS: 500.0000 mg | ORAL_TABLET | Freq: Two times a day (BID) | ORAL | 0 refills | Status: AC

## 2024-11-12 ENCOUNTER — Ambulatory Visit
